# Patient Record
Sex: Male | Born: 1995 | Race: White | Hispanic: No | Marital: Single | Smoking: Never smoker
Health system: Southern US, Community
[De-identification: ages and names within clinical notes are randomized; demographics above are authoritative.]

---

## 2015-06-10 ENCOUNTER — Ambulatory Visit
Admission: EM | Admit: 2015-06-10 | Discharge: 2015-06-10 | Disposition: A | Discharge status: Home / Self Care | Payer: Worker's Compensation | Attending: Family Medicine | Admitting: Family Medicine

## 2015-06-10 ENCOUNTER — Ambulatory Visit: Payer: Worker's Compensation

## 2015-06-10 DIAGNOSIS — M25531 Pain in right wrist: Secondary | ICD-10-CM | POA: Diagnosis present

## 2015-06-10 DIAGNOSIS — S63501A Unspecified sprain of right wrist, initial encounter: Secondary | ICD-10-CM

## 2015-06-10 DIAGNOSIS — X58XXXA Exposure to other specified factors, initial encounter: Secondary | ICD-10-CM | POA: Insufficient documentation

## 2015-06-10 MED ORDER — HYDROCODONE-ACETAMINOPHEN 5-325 MG PO TABS: 1.0000 | ORAL_TABLET | Freq: Four times a day (QID) | ORAL | Status: AC | PRN

## 2015-06-10 NOTE — ED Notes (Signed)
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

## 2015-06-10 NOTE — ED Provider Notes (Signed)
CSN: [id number]     Arrival date & time 06/10/15  1026 History   First MD Initiated Contact with Patient 06/10/15 1321     Chief Complaint  Patient presents with  . Wrist Pain   (Consider location/radiation/quality/duration/timing/severity/associated sxs/prior Treatment) HPI Comments: 19 yo male he with a complaint of a right wrist injury at work today. 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 States wrist became swollen. Denies numbness/tingling or fall.   The history is provided by the patient.    History reviewed. No pertinent past medical history. History reviewed. No pertinent past surgical history. History reviewed. No pertinent family history. History  Substance Use Topics  . Smoking status: Never Smoker   . Smokeless tobacco: Not on file  . Alcohol Use: No    Review of Systems  Allergies  Review of patient's allergies indicates no known allergies.  Home Medications   Prior to Admission medications   Medication Sig Start Date End Date Taking? Authorizing Provider  HYDROcodone-acetaminophen (NORCO/VICODIN) 5-325 MG per tablet Take 1-2 tablets by mouth every 6 (six) hours as needed. 06/10/15   Payton Mccallumrlando Anber Mckiver, MD   BP 98/57 mmHg  Temp(Src) 98.1 F (36.7 C) (Oral)  Resp 16  Ht 6' (1.829 m)  Wt 179 lb (81.194 kg)  BMI 24.27 kg/m2  SpO2 100% Physical Exam  Constitutional: He appears well-developed and well-nourished. No distress.  Musculoskeletal: He exhibits tenderness. He exhibits no edema.       Right wrist: He exhibits tenderness, bony tenderness and swelling (mild). He exhibits normal range of motion, no effusion, no crepitus, no deformity and no laceration.  Right hand neurovascularly intact  Skin: No rash noted. He is not diaphoretic.  Nursing note and vitals reviewed.   ED Course  Procedures (including critical care time) Labs  Review Labs Reviewed - No data to display  Imaging Review Dg Wrist Complete Right  06/10/2015   CLINICAL DATA:  Right wrist injury today.  Pain.  Initial encounter.  EXAM: RIGHT WRIST - COMPLETE 3+ VIEW  COMPARISON:  None.  FINDINGS: There is no evidence of fracture or dislocation. There is no evidence of arthropathy or other focal bone abnormality. Soft tissues are unremarkable.  IMPRESSION: Negative exam.   Electronically Signed   By: Drusilla Kannerhomas  Dalessio M.D.   On: 06/10/2015 13:51     MDM   1. Right wrist sprain, initial encounter    Discharge Medication List as of 06/10/2015  2:35 PM    START taking these medications   Details  HYDROcodone-acetaminophen (NORCO/VICODIN) 5-325 MG per tablet Take 1-2 tablets by mouth every 6 (six) hours as needed., Starting 06/10/2015, Until Discontinued, Print       Plan: 1. x-ray results (negative for fracture) and diagnosis reviewed with patient 2. rx as per orders; risks, benefits, potential side effects reviewed with patient 3. Recommend supportive treatment with ice, rest, gentle ROM; patient given velcro wrist splint for rest 4. Modified work (restrictions) for 6 days 4. F/u in 1 week for recheck; (note: patient states he might go see his regular PCP for follow up since he had to wait a long time here today; explained to patient he should check/verify with his Human Resources contact person at work if he's thinking of that due to [employer]/work injury).   Payton Mccallumrlando Braxtin Bamba, MD 06/10/15 [phone]

## 2015-06-17 ENCOUNTER — Encounter: Payer: Self-pay | Admitting: Emergency Medicine

## 2015-06-17 ENCOUNTER — Ambulatory Visit
Admission: EM | Admit: 2015-06-17 | Discharge: 2015-06-17 | Disposition: A | Discharge status: Home / Self Care | Payer: Worker's Compensation | Attending: Emergency Medicine | Admitting: Emergency Medicine

## 2015-06-17 DIAGNOSIS — S63501D Unspecified sprain of right wrist, subsequent encounter: Secondary | ICD-10-CM | POA: Diagnosis not present

## 2015-06-17 MED ORDER — MELOXICAM 15 MG PO TABS: 15.0000 mg | ORAL_TABLET | Freq: Every day | ORAL | Status: AC

## 2015-06-17 NOTE — ED Notes (Signed)
Patient here for worker's comp follow-up to injury to his right wrist.  Patient reports some pain this morning.

## 2015-06-17 NOTE — ED Provider Notes (Signed)
CSN: [id number]     Arrival date & time 06/17/15  0705 History   None    Chief Complaint  Patient presents with  . Worker's Comp Follow-up Visit    (Consider location/radiation/quality/duration/timing/severity/associated sxs/prior Treatment) HPI   Is an 19 year old male who returns today following a dominant right wrist injury that occurred at work. He saw Dr. Izell Dassel who gave him a right wrist splint and Vicodin for pain. The patient is back to work and wants to return to full duty. He states that certain activities such as per pressure on his hand in hyperextension seems to exacerbate his symptoms the most. Otherwise he is fairly comfortable and has noticed an improvement in his symptoms.   History reviewed. No pertinent past medical history. History reviewed. No pertinent past surgical history. History reviewed. No pertinent family history. History  Substance Use Topics  . Smoking status: Never Smoker   . Smokeless tobacco: Not on file  . Alcohol Use: No    Review of Systems  Constitutional: Positive for activity change.  Musculoskeletal: Positive for arthralgias.  All other systems reviewed and are negative.   Allergies  Review of patient's allergies indicates no known allergies.  Home Medications   Prior to Admission medications   Medication Sig Start Date End Date Taking? Authorizing Provider  HYDROcodone-acetaminophen (NORCO/VICODIN) 5-325 MG per tablet Take 1-2 tablets by mouth every 6 (six) hours as needed. 06/10/15   Payton Mccallum, MD  meloxicam (MOBIC) 15 MG tablet Take 1 tablet (15 mg total) by mouth daily. 06/17/15   Chrissie Noa Tinesha Siegrist, PA-C   BP 101/62 mmHg  Pulse 62  Temp(Src) 97.6 F (36.4 C) (Tympanic)  Resp 16  Ht 6' (1.829 m)  Wt 178 lb (80.74 kg)  BMI 24.14 kg/m2  SpO2 99% Physical Exam  Constitutional: He is oriented to person, place, and time. He appears well-developed and well-nourished.  HENT:  Head: Normocephalic and atraumatic.  Eyes: Pupils are  equal, round, and reactive to light.  Neck: Neck supple.  Musculoskeletal: Normal range of motion. He exhibits no edema.  Examination of the right wrist shows him to be wearing a Velcro wrist splint. If removal there is no ecchymosis or erythema or swelling. He has normal flexion and extension pronation and supination. There is only mild tenderness distal to the radial styloid and he also complains of tenderness over the proximal carpal row. His tenderness is minimal.  Neurological: He is alert and oriented to person, place, and time.  Skin: Skin is warm and dry.  Psychiatric: His behavior is normal. Judgment and thought content normal.  Patient does seem to have a rather flat affect  Nursing note and vitals reviewed.   ED Course  Procedures (including critical care time) Labs Review Labs Reviewed - No data to display  Imaging Review No results found.   MDM   1. Right wrist sprain, subsequent encounter    Discharge Medication List as of 06/17/2015  8:16 AM    START taking these medications   Details  meloxicam (MOBIC) 15 MG tablet Take 1 tablet (15 mg total) by mouth daily., Starting 06/17/2015, Until Discontinued, Print      Plan: 1. Test/x-ray results and diagnosis reviewed with patient 2. rx as per orders; risks, benefits, potential side effects reviewed with patient 3. Recommend supportive treatment with gradual increase of use as tolerated 4. F/u prn if symptoms worsen or don't improve. Will consider referral to hand surgery for evaluation if he does not improve conservatively. I've  asked him to begin taking the anti-inflammatories instead of the Vicodin for his pain. The patient feels as if he can return to work without restrictions and I have written a note to that end. He'll return to have an urgent care on an as necessary basis.      Lutricia FeilWilliam P Cambrea Kirt, PA-C 06/17/15 [phone]

## 2015-06-17 NOTE — Discharge Instructions (Signed)
Ligament Sprain °Ligaments are tough, fibrous tissues that hold bones together at the joints. A sprain can occur when a ligament is stretched. This injury may take several weeks to heal. °HOME CARE INSTRUCTIONS  °· Rest the injured area for as long as directed by your caregiver. Then slowly start using the joint as directed by your caregiver and as the pain allows. °· Keep the affected joint raised if possible to lessen swelling. °· Apply ice for 15-20 minutes to the injured area every couple hours for the first half day, then 03-04 times per day for the first 48 hours. Put the ice in a plastic bag and place a towel between the bag of ice and your skin. °· Wear any splinting, casting, or elastic bandage applications as instructed. °· Only take over-the-counter or prescription medicines for pain, discomfort, or fever as directed by your caregiver. Do not use aspirin immediately after the injury unless instructed by your caregiver. Aspirin can cause increased bleeding and bruising of the tissues. °· If you were given crutches, continue to use them as instructed and do not resume weight bearing on the affected extremity until instructed. °SEEK MEDICAL CARE IF:  °· Your bruising, swelling, or pain increases. °· You have cold and numb fingers or toes if your arm or leg was injured. °SEEK IMMEDIATE MEDICAL CARE IF:  °· Your toes are numb or blue if your leg was injured. °· Your fingers are numb or blue if your arm was injured. °· Your pain is not responding to medicines and continues to stay the same or gets worse. °MAKE SURE YOU:  °· Understand these instructions. °· Will watch your condition. °· Will get help right away if you are not doing well or get worse. °Document Released: 11/19/2000 Document Revised: 02/14/2012 Document Reviewed: 09/17/2008 °[employer]® Patient Information ©2015 [employer]. This information is not intended to replace advice given to you by your health care provider. Make sure you discuss any  questions you have with your health care provider. ° °

## 2016-03-19 IMAGING — CR DG WRIST COMPLETE 3+V*R*
4 series · 4 of 4 positions shown · non-contrast
Comparison: None.

CLINICAL DATA: Right wrist injury today.  Pain.  Initial encounter.

EXAM:
RIGHT WRIST - COMPLETE 3+ VIEW

[wrist pa (1 of 2)]
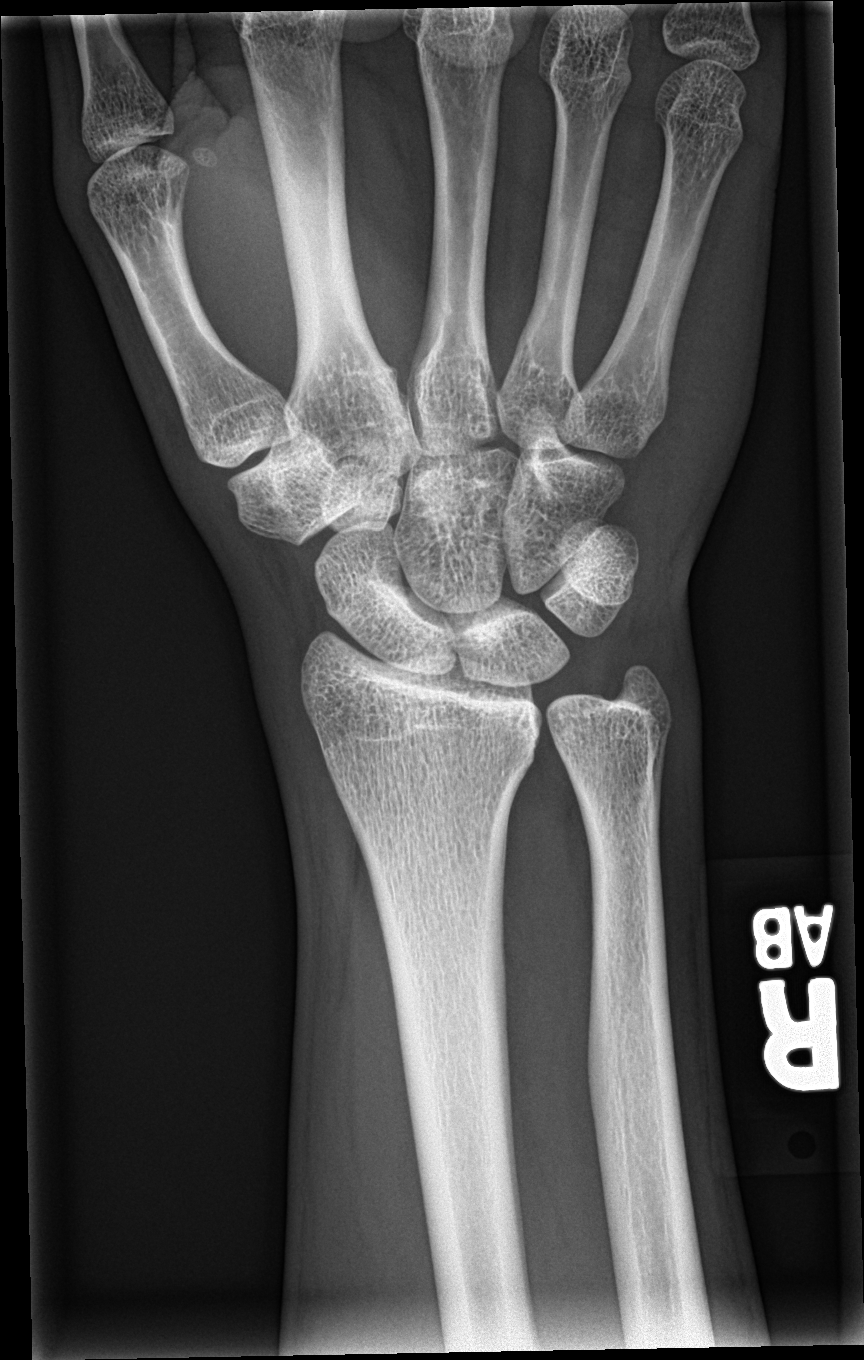

[wrist obl]
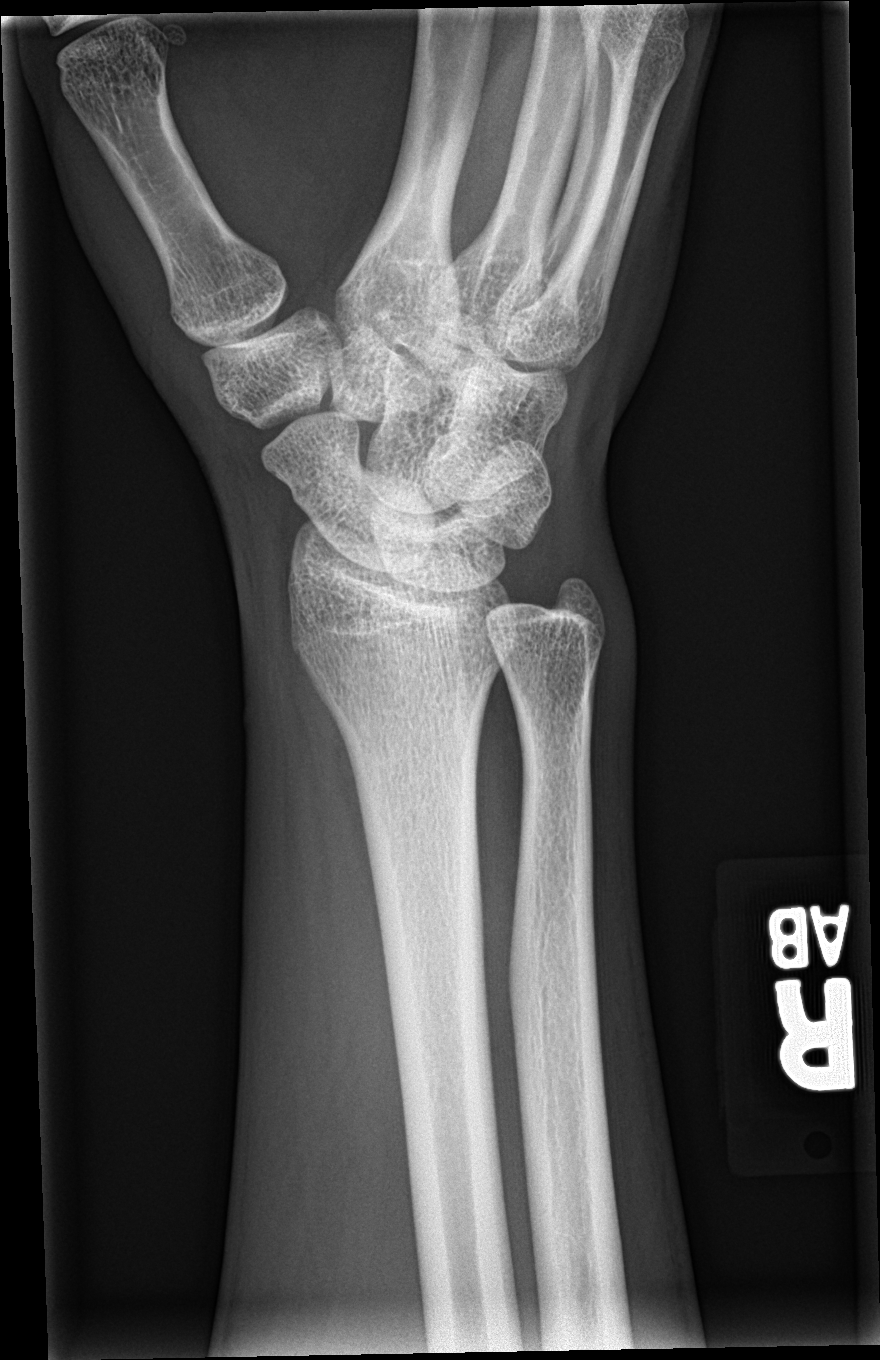

[wrist lat]
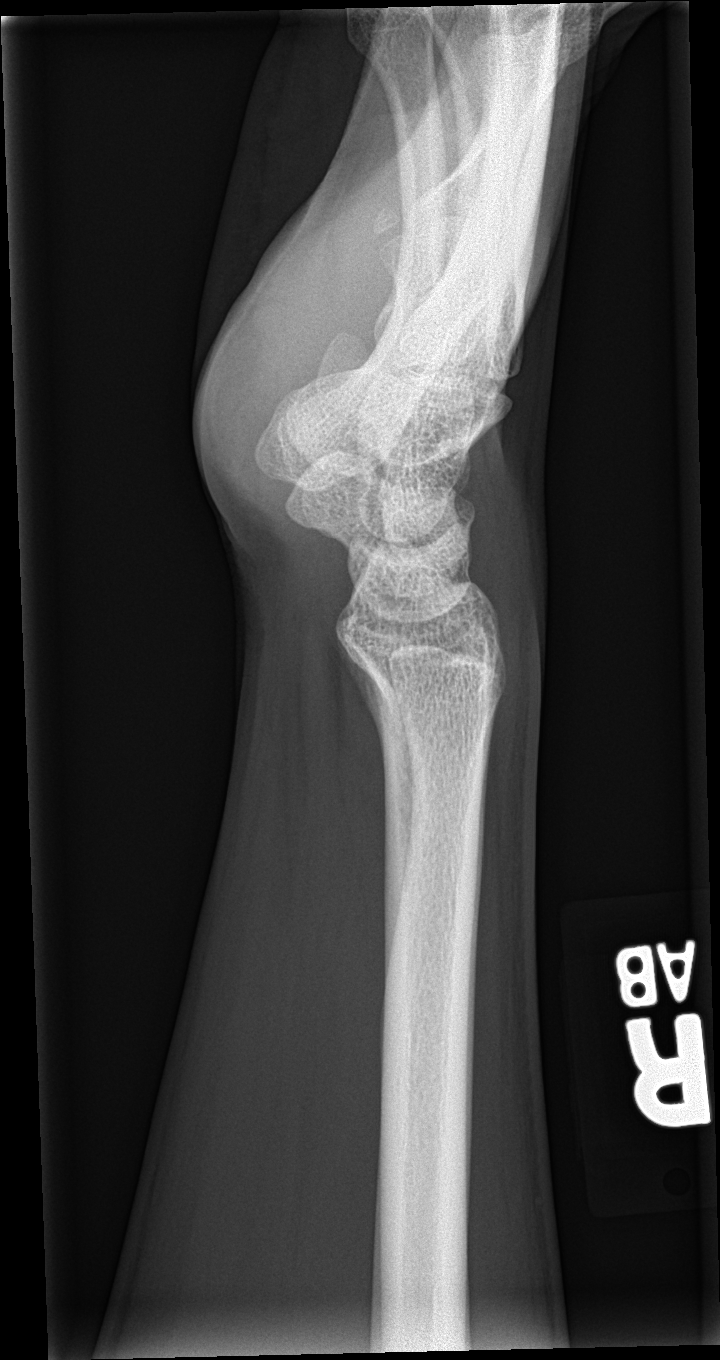

[wrist pa (2 of 2)]
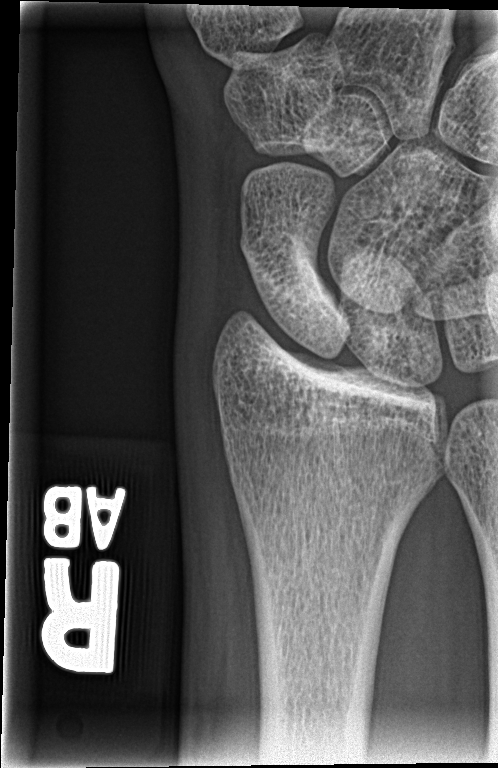

[4 of 4 positions shown; findings below may reference images not displayed]

FINDINGS: There is no evidence of fracture or dislocation. There is no
evidence of arthropathy or other focal bone abnormality. Soft
tissues are unremarkable.
IMPRESSION: Negative exam.
